# Patient Record
Sex: Male | Born: 1990 | Race: White | Hispanic: No | Marital: Single | Smoking: Never smoker
Health system: Southern US, Community
[De-identification: ages and names within clinical notes are randomized; demographics above are authoritative.]

## PROBLEM LIST (undated history)

## (undated) DIAGNOSIS — R51 Headache: Secondary | ICD-10-CM

## (undated) HISTORY — DX: Headache: R51

---
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
## 2012-05-31 ENCOUNTER — Telehealth: Payer: Self-pay | Admitting: Pediatrics

## 2012-05-31 NOTE — Telephone Encounter (Signed)
College form on your desk  °

## 2012-08-26 ENCOUNTER — Encounter: Payer: Self-pay | Admitting: Family

## 2012-08-26 ENCOUNTER — Ambulatory Visit (INDEPENDENT_AMBULATORY_CARE_PROVIDER_SITE_OTHER): Payer: 59 | Admitting: Family

## 2012-08-26 VITALS — BP 122/70 | HR 78 | Ht 67.5 in | Wt 167.0 lb

## 2012-08-26 DIAGNOSIS — G44219 Episodic tension-type headache, not intractable: Secondary | ICD-10-CM

## 2012-08-26 DIAGNOSIS — G43009 Migraine without aura, not intractable, without status migrainosus: Secondary | ICD-10-CM

## 2012-08-26 DIAGNOSIS — G43019 Migraine without aura, intractable, without status migrainosus: Secondary | ICD-10-CM

## 2012-08-26 MED ORDER — ONDANSETRON HCL 8 MG PO TABS: ORAL_TABLET | ORAL | Status: AC

## 2012-08-26 MED ORDER — TRAMADOL HCL 50 MG PO TABS: ORAL_TABLET | ORAL | Status: AC

## 2012-08-26 MED ORDER — SUMATRIPTAN SUCCINATE 50 MG PO TABS: ORAL_TABLET | ORAL | Status: AC
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
## 2012-08-28 ENCOUNTER — Encounter: Payer: Self-pay | Admitting: Family

## 2012-08-28 DIAGNOSIS — G43009 Migraine without aura, not intractable, without status migrainosus: Secondary | ICD-10-CM | POA: Insufficient documentation

## 2012-08-28 DIAGNOSIS — G44219 Episodic tension-type headache, not intractable: Secondary | ICD-10-CM | POA: Insufficient documentation

## 2012-08-28 DIAGNOSIS — G43019 Migraine without aura, intractable, without status migrainosus: Secondary | ICD-10-CM | POA: Insufficient documentation

## 2012-08-28 NOTE — Patient Instructions (Signed)
I have given you prescriptions for migraine treatment to use if you experience migraines while away at school. Please let me know if you experience an increase in migraine frequency or severity.  I will see you back in follow up in 1 year or sooner if needed.

## 2012-09-25 DIAGNOSIS — Z0289 Encounter for other administrative examinations: Secondary | ICD-10-CM

## 2017-02-25 ENCOUNTER — Other Ambulatory Visit: Payer: Self-pay | Admitting: Occupational Medicine

## 2017-02-25 ENCOUNTER — Ambulatory Visit
Admission: RE | Admit: 2017-02-25 | Discharge: 2017-02-25 | Disposition: A | Discharge status: Home / Self Care | Payer: No Typology Code available for payment source | Source: Ambulatory Visit | Attending: Occupational Medicine | Admitting: Occupational Medicine

## 2017-02-25 DIAGNOSIS — Z021 Encounter for pre-employment examination: Secondary | ICD-10-CM

## 2019-03-07 ENCOUNTER — Other Ambulatory Visit: Payer: Self-pay

## 2019-03-07 ENCOUNTER — Emergency Department (HOSPITAL_COMMUNITY): Payer: No Typology Code available for payment source

## 2019-03-07 ENCOUNTER — Encounter (HOSPITAL_COMMUNITY): Payer: Self-pay

## 2019-03-07 ENCOUNTER — Emergency Department (HOSPITAL_COMMUNITY)
Admission: EM | Admit: 2019-03-07 | Discharge: 2019-03-07 | Disposition: A | Discharge status: Home / Self Care | Payer: No Typology Code available for payment source | Attending: Emergency Medicine | Admitting: Emergency Medicine

## 2019-03-07 DIAGNOSIS — S8391XA Sprain of unspecified site of right knee, initial encounter: Secondary | ICD-10-CM | POA: Diagnosis not present

## 2019-03-07 DIAGNOSIS — Y929 Unspecified place or not applicable: Secondary | ICD-10-CM | POA: Insufficient documentation

## 2019-03-07 DIAGNOSIS — Y999 Unspecified external cause status: Secondary | ICD-10-CM | POA: Insufficient documentation

## 2019-03-07 DIAGNOSIS — S8392XA Sprain of unspecified site of left knee, initial encounter: Secondary | ICD-10-CM | POA: Diagnosis not present

## 2019-03-07 DIAGNOSIS — S86911A Strain of unspecified muscle(s) and tendon(s) at lower leg level, right leg, initial encounter: Secondary | ICD-10-CM

## 2019-03-07 DIAGNOSIS — S8992XA Unspecified injury of left lower leg, initial encounter: Secondary | ICD-10-CM | POA: Diagnosis present

## 2019-03-07 DIAGNOSIS — Y939 Activity, unspecified: Secondary | ICD-10-CM | POA: Diagnosis not present

## 2019-03-07 DIAGNOSIS — S86912A Strain of unspecified muscle(s) and tendon(s) at lower leg level, left leg, initial encounter: Secondary | ICD-10-CM
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
## 2020-03-22 ENCOUNTER — Other Ambulatory Visit: Payer: Self-pay

## 2020-03-22 ENCOUNTER — Ambulatory Visit (HOSPITAL_COMMUNITY)
Admission: EM | Admit: 2020-03-22 | Discharge: 2020-03-22 | Disposition: A | Discharge status: Home / Self Care | Payer: 59 | Attending: Student | Admitting: Student

## 2020-03-22 ENCOUNTER — Encounter (HOSPITAL_COMMUNITY): Payer: Self-pay | Admitting: Emergency Medicine

## 2020-03-22 DIAGNOSIS — H66002 Acute suppurative otitis media without spontaneous rupture of ear drum, left ear: Secondary | ICD-10-CM

## 2020-03-22 DIAGNOSIS — Z8616 Personal history of COVID-19: Secondary | ICD-10-CM | POA: Diagnosis not present

## 2020-03-22 MED ORDER — AMOXICILLIN-POT CLAVULANATE 875-125 MG PO TABS: 1.0000 | ORAL_TABLET | Freq: Two times a day (BID) | ORAL | 0 refills | Status: AC

## 2020-03-22 NOTE — ED Triage Notes (Signed)
Patient c/o LFT sided ear pain, LFt sided neck pain, and lymph node swelling x 1.5 weeks.   Patient denies fever at home.   Patient endorses " a little ringing in left ear".   Patient endorses having COVID19 1 month ago.   Patient has used OTC medications with no relief of symptoms.

## 2020-03-22 NOTE — ED Provider Notes (Signed)
[facility]    CSN: [id number] Arrival date & time: 03/22/20  1902      History   Chief Complaint Chief Complaint  Patient presents with  . Otalgia  . Neck Pain  . Lymphadenopathy    HPI Ernest Nelson is a 30 y.o. male presenting with left ear pain x1.5 weeks.  History COVID-19 1 month ago.  History of migraines, allergic rhinitis, GERD.  Patient states that his left ear has hurt for 1 1/2 weeks, with some tinnitus.  Also with left-sided cervical lymphadenopathy.  Denies fevers denies dizziness denies lightheadedness denies headaches denies cough denies sore throat.  HPI  Past Medical History:  Diagnosis Date  . LSLHTDSK(876.8)     Patient Active Problem List   Diagnosis Date Noted  . Migraine without aura, without mention of intractable migraine without mention of status migrainosus 08/28/2012  . Migraine without aura, with intractable migraine, so stated, without mention of status migrainosus 08/28/2012  . Episodic tension type headache 08/28/2012  . ALLERGIC RHINITIS 05/17/2008  . ACID REFLUX DISEASE 05/17/2008  . HEADACHE, CHRONIC 05/17/2008  . HEMOPTYSIS 05/17/2008    History reviewed. No pertinent surgical history.     Home Medications    Prior to Admission medications   Medication Sig Start Date End Date Taking? Authorizing Provider  amoxicillin-clavulanate (AUGMENTIN) 875-125 MG tablet Take 1 tablet by mouth every 12 (twelve) hours. 03/22/20  Yes Rhys Martini, PA-C  ibuprofen (ADVIL,MOTRIN) 200 MG tablet Take 200 mg by mouth every 6 (six) hours as needed for pain. 2 to 3 Tabs by mouth as needed for migraine/headache.    [provider]  ondansetron (ZOFRAN) 8 MG tablet Take 1 at onset of nausea and vomiting 08/26/12   Elveria Rising, NP  SUMAtriptan (IMITREX) 50 MG tablet Take 1 at onset of migraine, may repeat x 1 in 2 hours if needed 08/26/12   Elveria Rising, NP  traMADol Janean Sark) 50 MG tablet Take 1-2 tablets every 6 hours as  needed for severe pain 08/26/12   Elveria Rising, NP    Family History Family History  Problem Relation Age of Onset  . Heart attack Maternal Grandmother   . Cancer Paternal Grandfather        Died in his 13's    Social History Social History   Tobacco Use  . Smoking status: Never Smoker  . Smokeless tobacco: Never Used  Substance Use Topics  . Alcohol use: No  . Drug use: No     Allergies   Amitriptyline hcl and Propranolol hcl   Review of Systems Review of Systems  Constitutional: Negative for appetite change, chills and fever.  HENT: Positive for ear pain and tinnitus. Negative for congestion, rhinorrhea, sinus pressure, sinus pain and sore throat.   Eyes: Negative for redness and visual disturbance.  Respiratory: Negative for cough, chest tightness, shortness of breath and wheezing.   Cardiovascular: Negative for chest pain and palpitations.  Gastrointestinal: Negative for abdominal pain, constipation, diarrhea, nausea and vomiting.  Genitourinary: Negative for dysuria, frequency and urgency.  Musculoskeletal: Negative for myalgias.  Neurological: Negative for dizziness, weakness and headaches.  Psychiatric/Behavioral: Negative for confusion.  All other systems reviewed and are negative.    Physical Exam Triage Vital Signs ED Triage Vitals  Enc Vitals Group     BP      Pulse      Resp      Temp      Temp src  SpO2      Weight      Height      Head Circumference      Peak Flow      Pain Score      Pain Loc      Pain Edu?      Excl. in GC?    No data found.  Updated Vital Signs BP 140/67 (BP Location: Right Arm)   Pulse (!) 104   Temp 98.7 F (37.1 C) (Oral)   Resp 16   SpO2 100%   Visual Acuity Right Eye Distance:   Left Eye Distance:   Bilateral Distance:    Right Eye Near:   Left Eye Near:    Bilateral Near:     Physical Exam Vitals reviewed.  Constitutional:      General: He is not in acute distress.    Appearance: Normal  appearance. He is not ill-appearing.  HENT:     Head: Normocephalic and atraumatic.     Right Ear: Hearing, tympanic membrane, ear canal and external ear normal. No swelling or tenderness. There is no impacted cerumen. No mastoid tenderness. Tympanic membrane is not perforated, erythematous, retracted or bulging.     Left Ear: Hearing and ear canal normal. Tenderness present. No swelling. There is no impacted cerumen. There is mastoid tenderness. Tympanic membrane is erythematous. Tympanic membrane is not perforated, retracted or bulging.     Nose:     Right Sinus: No maxillary sinus tenderness or frontal sinus tenderness.     Left Sinus: No maxillary sinus tenderness or frontal sinus tenderness.     Mouth/Throat:     Mouth: Mucous membranes are moist.     Pharynx: Uvula midline. No oropharyngeal exudate or posterior oropharyngeal erythema.     Tonsils: No tonsillar exudate.  Cardiovascular:     Rate and Rhythm: Regular rhythm. Tachycardia present.     Heart sounds: Normal heart sounds.  Pulmonary:     Breath sounds: Normal breath sounds and air entry. No wheezing, rhonchi or rales.  Chest:     Chest wall: No tenderness.  Abdominal:     General: Abdomen is flat. Bowel sounds are normal.     Tenderness: There is no abdominal tenderness. There is no guarding or rebound.  Lymphadenopathy:     Cervical: No cervical adenopathy.  Neurological:     General: No focal deficit present.     Mental Status: He is alert and oriented to person, place, and time.  Psychiatric:        Attention and Perception: Attention and perception normal.        Mood and Affect: Mood and affect normal.        Behavior: Behavior normal. Behavior is cooperative.        Thought Content: Thought content normal.        Judgment: Judgment normal.      UC Treatments / Results  Labs (all labs ordered are listed, but only abnormal results are displayed) Labs Reviewed - No data to display  EKG   Radiology No  results found.  Procedures Procedures (including critical care time)  Medications Ordered in UC Medications - No data to display  Initial Impression / Assessment and Plan / UC Course  I have reviewed the triage vital signs and the nursing notes.  Pertinent labs & imaging results that were available during my care of the patient were reviewed by me and considered in my medical decision making (see chart for details).  This patient is a 30 year old male presenting with AOM.  Plan to treat with augmentin as below.  History of COVID-19 1 month ago, so declines Covid testing today in accordance with CDC guidelines.  Return precautions discussed.  This chart was dictated using voice recognition software, Dragon. Despite the best efforts of this provider to proofread and correct errors, errors may still occur which can change documentation meaning.  Final Clinical Impressions(s) / UC Diagnoses   Final diagnoses:  History of COVID-19  Acute suppurative otitis media of left ear     Discharge Instructions     -Start the antibiotic-Augmentin, 2 pills daily for 7 days.  You can take this with food if you have a sensitive stomach, like with breakfast and dinner. -Seek additional medical treatment if you experience worsening of symptoms despite treatment, like new fever/chills, headaches, dizziness, shortness of breath, chest pain.    ED Prescriptions    Medication Sig Dispense Auth. Provider   amoxicillin-clavulanate (AUGMENTIN) 875-125 MG tablet Take 1 tablet by mouth every 12 (twelve) hours. 14 tablet Rhys Martini, PA-C     PDMP not reviewed this encounter.   Rhys Martini, PA-C 03/22/20 1934

## 2020-03-22 NOTE — Discharge Instructions (Addendum)
-  Start the antibiotic-Augmentin, 2 pills daily for 7 days.  You can take this with food if you have a sensitive stomach, like with breakfast and dinner. -Seek additional medical treatment if you experience worsening of symptoms despite treatment, like new fever/chills, headaches, dizziness, shortness of breath, chest pain.

## 2020-03-25 ENCOUNTER — Emergency Department (HOSPITAL_COMMUNITY)
Admission: EM | Admit: 2020-03-25 | Discharge: 2020-03-25 | Disposition: A | Discharge status: Home / Self Care | Payer: No Typology Code available for payment source | Attending: Emergency Medicine | Admitting: Emergency Medicine

## 2020-03-25 DIAGNOSIS — Z7721 Contact with and (suspected) exposure to potentially hazardous body fluids: Secondary | ICD-10-CM | POA: Diagnosis not present

## 2020-03-25 LAB — HEPATITIS PANEL, ACUTE
HCV Ab: NONREACTIVE
Hep A IgM: NONREACTIVE
Hep B C IgM: NONREACTIVE
Hepatitis B Surface Ag: NONREACTIVE

## 2020-03-25 LAB — RAPID HIV SCREEN (HIV 1/2 AB+AG)
HIV 1/2 Antibodies: NONREACTIVE
HIV-1 P24 Antigen - HIV24: NONREACTIVE

## 2020-03-25 LAB — CBC WITH DIFFERENTIAL/PLATELET
Abs Immature Granulocytes: 0 10*3/uL (ref 0.00–0.07)
Basophils Absolute: 0.2 10*3/uL — ABNORMAL HIGH (ref 0.0–0.1)
Basophils Relative: 2 %
Eosinophils Absolute: 0 10*3/uL (ref 0.0–0.5)
Eosinophils Relative: 0 %
HCT: 39.8 % (ref 39.0–52.0)
Hemoglobin: 14 g/dL (ref 13.0–17.0)
Lymphocytes Relative: 65 %
Lymphs Abs: 6.8 10*3/uL — ABNORMAL HIGH (ref 0.7–4.0)
MCH: 31.9 pg (ref 26.0–34.0)
MCHC: 35.2 g/dL (ref 30.0–36.0)
MCV: 90.7 fL (ref 80.0–100.0)
Monocytes Absolute: 0.4 10*3/uL (ref 0.1–1.0)
Monocytes Relative: 4 %
Neutro Abs: 3 10*3/uL (ref 1.7–7.7)
Neutrophils Relative %: 29 %
Platelets: 157 10*3/uL (ref 150–400)
RBC: 4.39 MIL/uL (ref 4.22–5.81)
RDW: 13.8 % (ref 11.5–15.5)
WBC: 10.5 10*3/uL (ref 4.0–10.5)
nRBC: 0 % (ref 0.0–0.2)
nRBC: 0 /100 WBC

## 2020-03-25 LAB — COMPREHENSIVE METABOLIC PANEL
ALT: 151 U/L — ABNORMAL HIGH (ref 0–44)
AST: 106 U/L — ABNORMAL HIGH (ref 15–41)
Albumin: 4.6 g/dL (ref 3.5–5.0)
Alkaline Phosphatase: 96 U/L (ref 38–126)
Anion gap: 10 (ref 5–15)
BUN: 8 mg/dL (ref 6–20)
CO2: 25 mmol/L (ref 22–32)
Calcium: 9.4 mg/dL (ref 8.9–10.3)
Chloride: 102 mmol/L (ref 98–111)
Creatinine, Ser: 0.85 mg/dL (ref 0.61–1.24)
GFR, Estimated: >60 mL/min (ref >60)
Glucose, Bld: 94 mg/dL (ref 70–99)
Potassium: 4 mmol/L (ref 3.5–5.1)
Sodium: 137 mmol/L (ref 135–145)
Total Bilirubin: 1.9 mg/dL — ABNORMAL HIGH (ref 0.3–1.2)
Total Protein: 7.9 g/dL (ref 6.5–8.1)

## 2020-03-25 LAB — PATHOLOGIST SMEAR REVIEW
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
## 2020-03-25 NOTE — Discharge Instructions (Addendum)
-  Source patient HIV is negative  -You should rinse your eye out at home  -Follow-up with primary care doctor to see hepatitis panel results.  They should also be available online in my chart once a day result.  -You should also follow-up with primary care doctor to have your liver enzymes rechecked.  As we discussed your elevated today.  This could be secondary to a viral illness that you have currently.  Return to emergency room for new or worsening symptoms.
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
## 2023-07-27 ENCOUNTER — Emergency Department (HOSPITAL_COMMUNITY)
Admission: EM | Admit: 2023-07-27 | Discharge: 2023-07-27 | Disposition: A | Discharge status: Home / Self Care | Payer: Worker's Compensation | Attending: Emergency Medicine | Admitting: Emergency Medicine

## 2023-07-27 ENCOUNTER — Other Ambulatory Visit: Payer: Self-pay

## 2023-07-27 ENCOUNTER — Emergency Department (HOSPITAL_COMMUNITY): Payer: Worker's Compensation

## 2023-07-27 DIAGNOSIS — R202 Paresthesia of skin: Secondary | ICD-10-CM | POA: Insufficient documentation

## 2023-07-27 DIAGNOSIS — M79631 Pain in right forearm: Secondary | ICD-10-CM | POA: Insufficient documentation

## 2023-07-27 DIAGNOSIS — Y990 Civilian activity done for income or pay: Secondary | ICD-10-CM | POA: Insufficient documentation

## 2023-07-27 DIAGNOSIS — W228XXA Striking against or struck by other objects, initial encounter: Secondary | ICD-10-CM | POA: Insufficient documentation

## 2023-07-27 DIAGNOSIS — S4991XA Unspecified injury of right shoulder and upper arm, initial encounter: Secondary | ICD-10-CM
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
## 2023-07-27 NOTE — Discharge Instructions (Signed)
 Your x-ray this morning was reassuring.  As discussed I recommend using ice and anti-inflammatory medications for pain relief.  If you do not see improvement please follow-up with orthopedic surgery for further evaluation.
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
